# Patient Record
Sex: Female | Born: 2000 | Race: White | Hispanic: No | Marital: Single | Smoking: Never smoker
Health system: Southern US, Community
[De-identification: ages and names within clinical notes are randomized; demographics above are authoritative.]

## PROBLEM LIST (undated history)

## (undated) DIAGNOSIS — N809 Endometriosis, unspecified: Secondary | ICD-10-CM

---

## 2019-08-21 ENCOUNTER — Other Ambulatory Visit: Payer: Self-pay

## 2019-08-21 ENCOUNTER — Encounter: Payer: Self-pay | Admitting: Emergency Medicine

## 2019-08-21 ENCOUNTER — Emergency Department
Admission: EM | Admit: 2019-08-21 | Discharge: 2019-08-21 | Disposition: A | Discharge status: Home / Self Care | Payer: Medicaid - Out of State | Attending: Emergency Medicine | Admitting: Emergency Medicine

## 2019-08-21 ENCOUNTER — Emergency Department: Payer: Medicaid - Out of State

## 2019-08-21 DIAGNOSIS — Y929 Unspecified place or not applicable: Secondary | ICD-10-CM | POA: Diagnosis not present

## 2019-08-21 DIAGNOSIS — Z79899 Other long term (current) drug therapy: Secondary | ICD-10-CM | POA: Insufficient documentation

## 2019-08-21 DIAGNOSIS — W2109XA Struck by other hit or thrown ball, initial encounter: Secondary | ICD-10-CM | POA: Insufficient documentation

## 2019-08-21 DIAGNOSIS — Y9369 Activity, other involving other sports and athletics played as a team or group: Secondary | ICD-10-CM | POA: Diagnosis not present

## 2019-08-21 DIAGNOSIS — S62307A Unspecified fracture of fifth metacarpal bone, left hand, initial encounter for closed fracture: Secondary | ICD-10-CM | POA: Insufficient documentation

## 2019-08-21 DIAGNOSIS — S65302A Unspecified injury of deep palmar arch of left hand, initial encounter: Secondary | ICD-10-CM | POA: Diagnosis present

## 2019-08-21 DIAGNOSIS — Y999 Unspecified external cause status: Secondary | ICD-10-CM | POA: Insufficient documentation

## 2019-08-21 DIAGNOSIS — S6992XA Unspecified injury of left wrist, hand and finger(s), initial encounter: Secondary | ICD-10-CM

## 2019-08-21 HISTORY — DX: Endometriosis, unspecified: N80.9

## 2019-08-21 MED ORDER — IBUPROFEN 400 MG PO TABS
400.0000 mg | ORAL_TABLET | Freq: Four times a day (QID) | ORAL | 0 refills | Status: AC | PRN
Start: 2019-08-21 — End: ?

## 2019-08-21 MED ORDER — ONDANSETRON 4 MG PO TBDP
4.0000 mg | ORAL_TABLET | Freq: Once | ORAL | Status: AC
  Administered 2019-08-21: 4 mg via ORAL
  Filled 2019-08-21: qty 1

## 2019-08-21 NOTE — ED Triage Notes (Signed)
States she injured her hand while playing dodge ball  States left 4th and 5 th finger bent back  Swelling noted laterally

## 2019-08-21 NOTE — ED Provider Notes (Signed)
[facility] Emergency Department Provider Note  ____________________________________________  Time seen: Approximately 11:40 AM  I have reviewed the triage vital signs and the nursing notes.   HISTORY  Chief Complaint Hand Injury    HPI Zoe Hancock is a 19 y.o. female that presents to the emergency department for evaluation of left hand pain after injury yesterday.  Patient was playing dodgeball when the ball hit her hand and her fourth and fifth fingers bent backwards.  Patient has pain to the side of her hand.  No additional injuries.    Past Medical History:  Diagnosis Date  . Endometriosis     There are no problems to display for this patient.   History reviewed. No pertinent surgical history.  Prior to Admission medications   Medication Sig Start Date End Date Taking? Authorizing Provider  sertraline (ZOLOFT) 50 MG tablet Take 50 mg by mouth daily.   Yes [provider]  spironolactone (ALDACTONE) 50 MG tablet Take 50 mg by mouth daily.   Yes [provider]  ibuprofen (ADVIL) 400 MG tablet Take 1 tablet (400 mg total) by mouth every 6 (six) hours as needed. 08/21/19   Enid Derry, PA-C    Allergies Patient has no known allergies.  No family history on file.  Social History Social History   Tobacco Use  . Smoking status: Never Smoker  . Smokeless tobacco: Never Used  Substance Use Topics  . Alcohol use: Yes  . Drug use: Not on file     Review of Systems  Respiratory: No SOB. Gastrointestinal: Positive for nausea.  No vomiting. Musculoskeletal: Positive for hand pain. Skin: Negative for rash, abrasions, lacerations, ecchymosis. Neurological: Negative for numbness or tingling   ____________________________________________   PHYSICAL EXAM:  VITAL SIGNS: ED Triage Vitals  Enc Vitals Group     BP 08/21/19 1057 120/78     Pulse Rate 08/21/19 1057 88     Resp 08/21/19 1057 18     Temp 08/21/19 1057 98 F  (36.7 C)     Temp src --      SpO2 08/21/19 1057 99 %     Weight 08/21/19 1051 160 lb (72.6 kg)     Height 08/21/19 1051 5\' 4"  (1.626 m)     Head Circumference --      Peak Flow --      Pain Score 08/21/19 1051 6     Pain Loc --      Pain Edu? --      Excl. in GC? --      Constitutional: Alert and oriented. Well appearing and in no acute distress. Eyes: Conjunctivae are normal. PERRL. EOMI. Head: Atraumatic. ENT:      Ears:      Nose: No congestion/rhinnorhea.      Mouth/Throat: Mucous membranes are moist.  Neck: No stridor.   Cardiovascular: Normal rate, regular rhythm.  Good peripheral circulation.  Symmetric radial pulses bilaterally. Respiratory: Normal respiratory effort without tachypnea or retractions. Lungs CTAB. Good air entry to the bases with no decreased or absent breath sounds. Musculoskeletal: Full range of motion to all extremities. No gross deformities appreciated.  Mild swelling and ecchymosis to left lateral hand.  Full range of motion of fingers. Neurologic:  Normal speech and language. No gross focal neurologic deficits are appreciated.  Skin:  Skin is warm, dry and intact. No rash noted. Psychiatric: Mood and affect are normal. Speech and behavior are normal. Patient exhibits appropriate insight and judgement.  ____________________________________________   LABS (all labs ordered are listed, but only abnormal results are displayed)  Labs Reviewed - No data to display ____________________________________________  EKG   ____________________________________________  RADIOLOGY Robinette Haines, personally viewed and evaluated these images (plain radiographs) as part of my medical decision making, as well as reviewing the written report by the radiologist.  DG Hand Complete Left  Result Date: 08/21/2019 CLINICAL DATA:  Left hand injury. EXAM: LEFT HAND - COMPLETE 3+ VIEW COMPARISON:  No prior. FINDINGS: Nondisplaced oblique fracture of the left fifth  metacarpal cannot be excluded. No other focal abnormality identified. No radiopaque foreign body. IMPRESSION: Nondisplaced oblique fracture of the left fifth metacarpal cannot be excluded. Electronically Signed   By: Marcello Moores  Register   On: 08/21/2019 11:21    ____________________________________________    PROCEDURES  Procedure(s) performed:    Procedures    Medications  ondansetron (ZOFRAN-ODT) disintegrating tablet 4 mg (4 mg Oral Given 08/21/19 1148)     ____________________________________________   INITIAL IMPRESSION / ASSESSMENT AND PLAN / ED COURSE  Pertinent labs & imaging results that were available during my care of the patient were reviewed by me and considered in my medical decision making (see chart for details).  Review of the [place] CSRS was performed in accordance of the [place] prior to dispensing any controlled drugs.     Patient's diagnosis is consistent with fifth metacarpal fracture.  Vital signs and exam are reassuring.  X-ray consistent with possible fracture.  Ulnar gutter splint was placed.  Sling was given.  Patient will be discharged home with prescriptions for Motrin. Patient is to follow up with orthopedics as directed. Patient is given ED precautions to return to the ED for any worsening or new symptoms.  [name] was evaluated in Emergency Department on 08/21/2019 for the symptoms described in the history of present illness. She was evaluated in the context of the global COVID-19 pandemic, which necessitated consideration that the patient might be at risk for infection with the SARS-CoV-2 virus that causes COVID-19. Institutional protocols and algorithms that pertain to the evaluation of patients at risk for COVID-19 are in a state of rapid change based on information released by regulatory bodies including the CDC and federal and state organizations. These policies and algorithms were followed during the patient's care in the  ED.   ____________________________________________  FINAL CLINICAL IMPRESSION(S) / ED DIAGNOSES  Final diagnoses:  Closed nondisplaced fracture of fifth metacarpal bone of left hand, unspecified portion of metacarpal, initial encounter      NEW MEDICATIONS STARTED DURING THIS VISIT:  ED Discharge Orders         Ordered    ibuprofen (ADVIL) 400 MG tablet  Every 6 hours PRN     08/21/19 1144              This chart was dictated using voice recognition software/Dragon. Despite best efforts to proofread, errors can occur which can change the meaning. Any change was purely unintentional.    Laban Emperor, PA-C 08/21/19 1342    Blake Divine, MD 08/21/19 [phone]

## 2021-09-12 IMAGING — DX DG HAND COMPLETE 3+V*L*
3 series · 3 of 3 positions shown · non-contrast
Comparison: No prior.

CLINICAL DATA: Left hand injury.

EXAM:
LEFT HAND - COMPLETE 3+ VIEW

[hand ap]
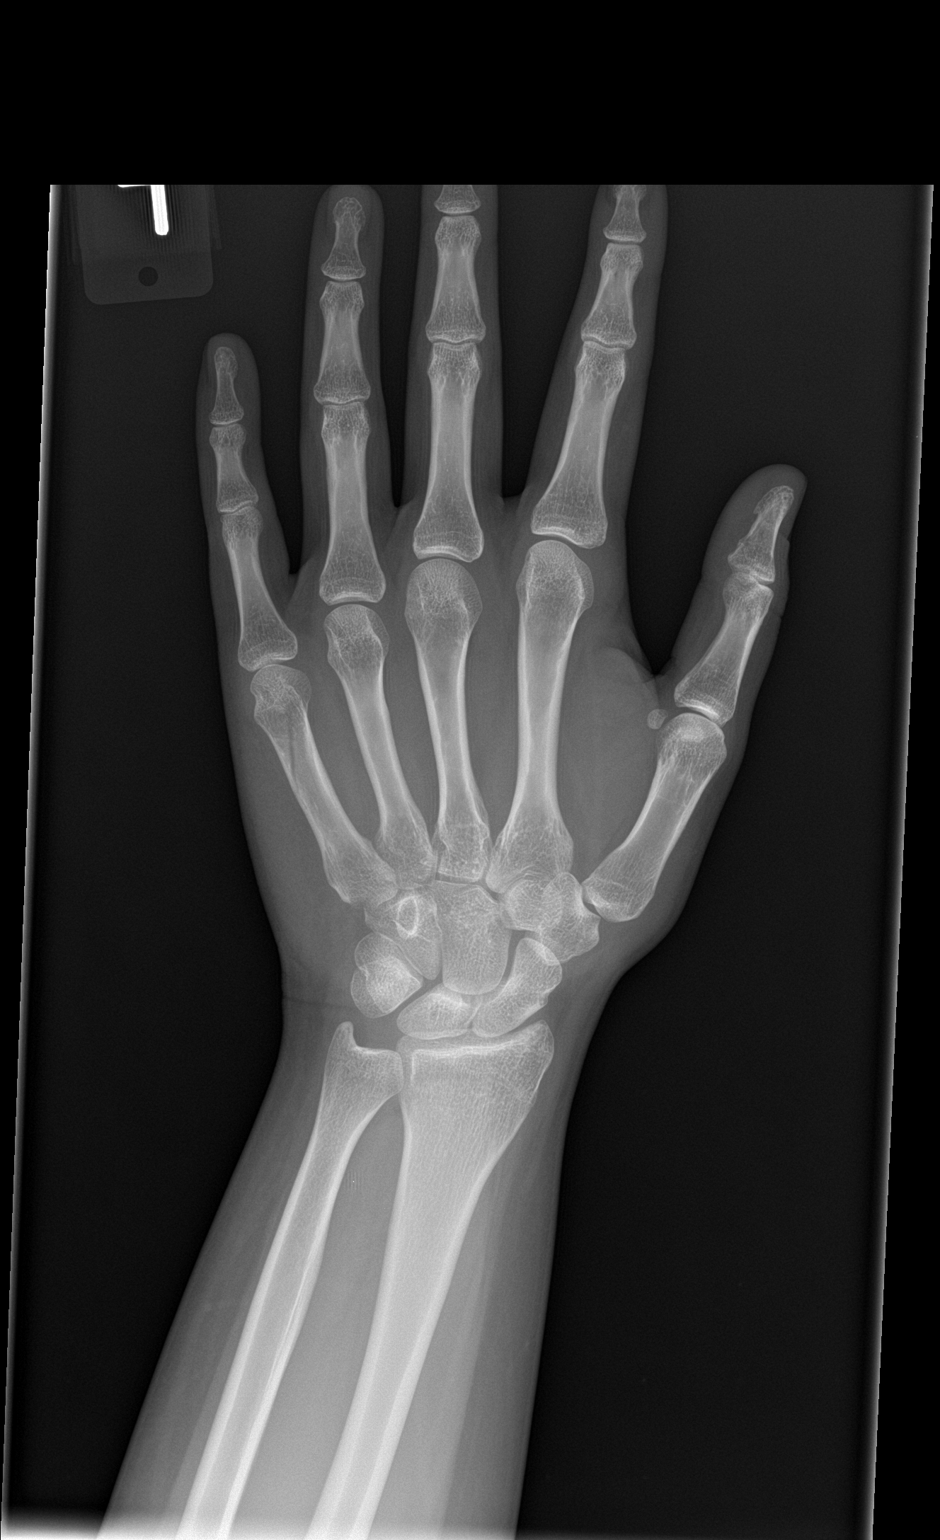

[hand obl]
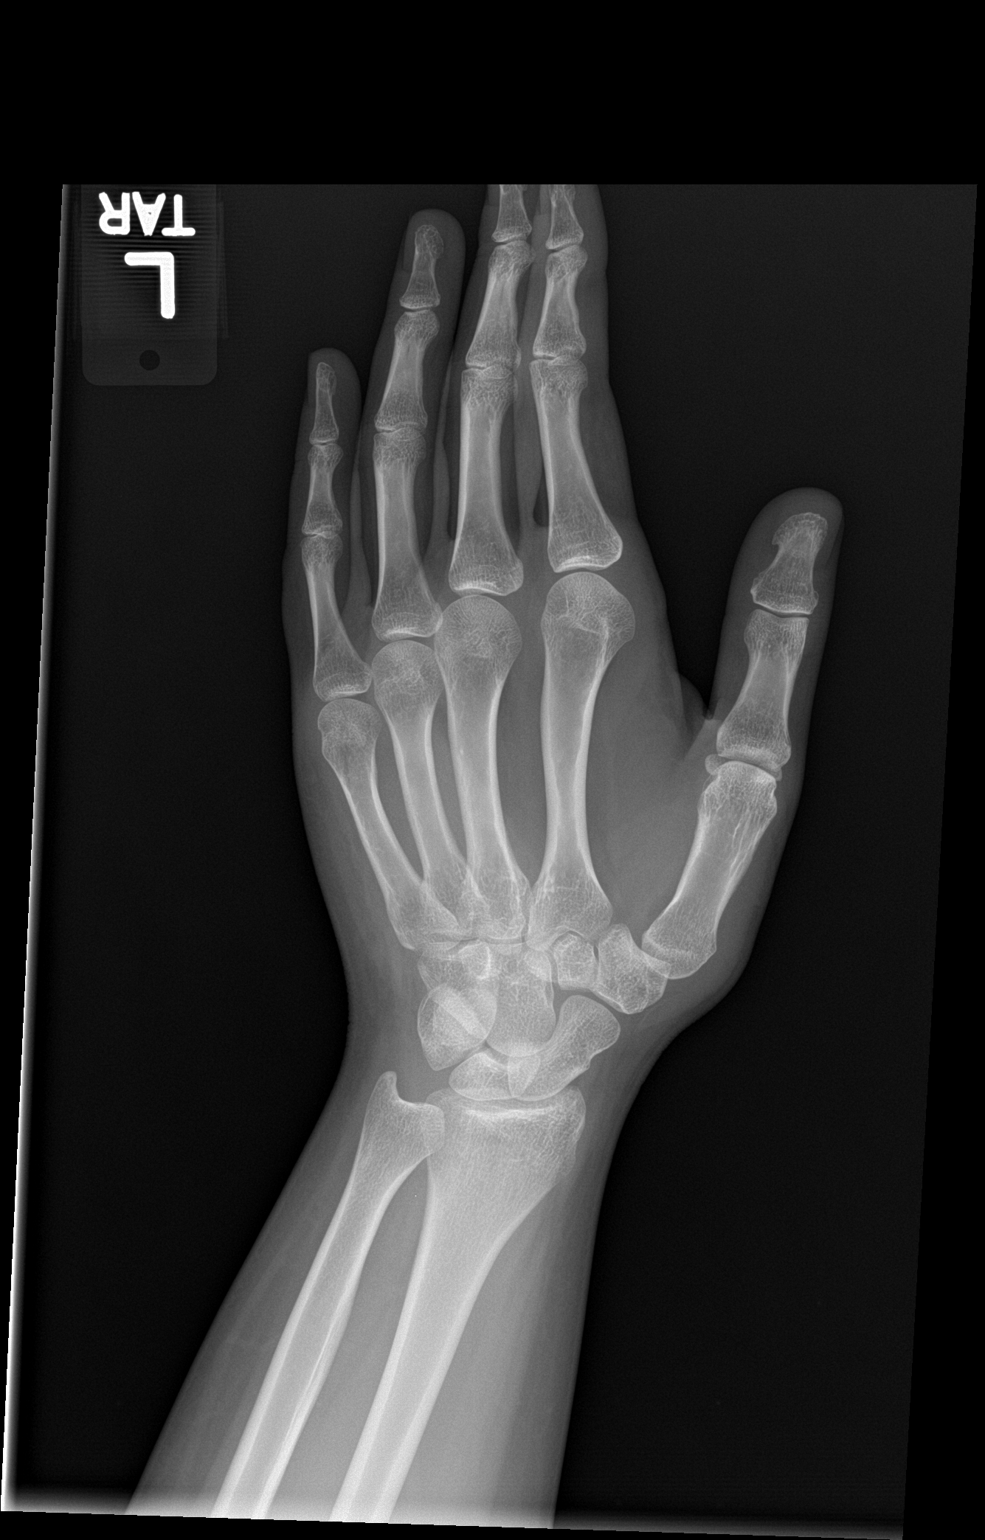

[hand lat]
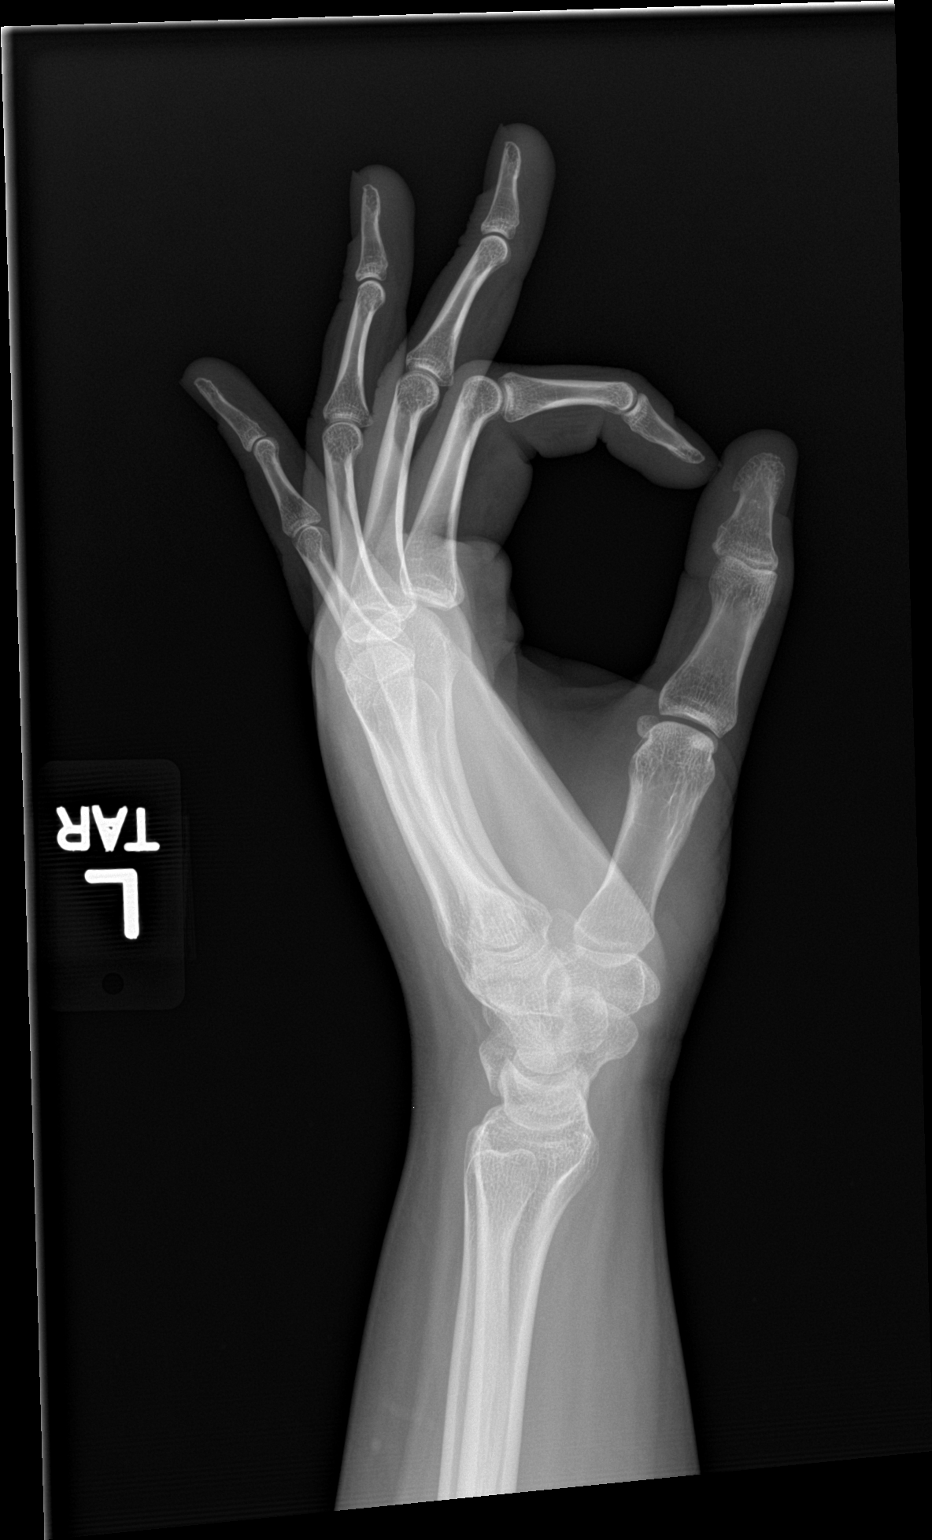

[3 of 3 positions shown; findings below may reference images not displayed]

FINDINGS: Nondisplaced oblique fracture of the left fifth metacarpal cannot be
excluded. No other focal abnormality identified. No radiopaque
foreign body.
IMPRESSION: Nondisplaced oblique fracture of the left fifth metacarpal cannot be
excluded.
